# Patient Record
Sex: Female | Born: 1945 | Race: Black or African American | Hispanic: No | Marital: Married | Smoking: Never smoker
Health system: Southern US, Community
[De-identification: ages and names within clinical notes are randomized; demographics above are authoritative.]

## PROBLEM LIST (undated history)

## (undated) DIAGNOSIS — J45909 Unspecified asthma, uncomplicated: Secondary | ICD-10-CM

## (undated) DIAGNOSIS — M5126 Other intervertebral disc displacement, lumbar region: Secondary | ICD-10-CM

## (undated) DIAGNOSIS — IMO0002 Reserved for concepts with insufficient information to code with codable children: Secondary | ICD-10-CM

## (undated) DIAGNOSIS — M5136 Other intervertebral disc degeneration, lumbar region with discogenic back pain only: Secondary | ICD-10-CM

## (undated) DIAGNOSIS — I10 Essential (primary) hypertension: Secondary | ICD-10-CM

## (undated) DIAGNOSIS — R635 Abnormal weight gain: Secondary | ICD-10-CM

## (undated) DIAGNOSIS — K760 Fatty (change of) liver, not elsewhere classified: Secondary | ICD-10-CM

## (undated) DIAGNOSIS — E8940 Asymptomatic postprocedural ovarian failure: Secondary | ICD-10-CM

## (undated) DIAGNOSIS — G4700 Insomnia, unspecified: Secondary | ICD-10-CM

## (undated) DIAGNOSIS — B373 Candidiasis of vulva and vagina: Secondary | ICD-10-CM

## (undated) DIAGNOSIS — B3731 Acute candidiasis of vulva and vagina: Secondary | ICD-10-CM

## (undated) DIAGNOSIS — E785 Hyperlipidemia, unspecified: Secondary | ICD-10-CM

## (undated) DIAGNOSIS — K219 Gastro-esophageal reflux disease without esophagitis: Secondary | ICD-10-CM

## (undated) HISTORY — PX: DILATION AND CURETTAGE OF UTERUS: SHX78

## (undated) HISTORY — DX: Reserved for concepts with insufficient information to code with codable children: IMO0002

## (undated) HISTORY — PX: HYSTEROSCOPY: SHX211

## (undated) HISTORY — DX: Gastro-esophageal reflux disease without esophagitis: K21.9

## (undated) HISTORY — PX: OTHER SURGICAL HISTORY: SHX169

## (undated) HISTORY — DX: Unspecified asthma, uncomplicated: J45.909

## (undated) HISTORY — DX: Fatty (change of) liver, not elsewhere classified: K76.0

## (undated) HISTORY — DX: Asymptomatic postprocedural ovarian failure: E89.40

## (undated) HISTORY — DX: Candidiasis of vulva and vagina: B37.3

## (undated) HISTORY — DX: Abnormal weight gain: R63.5

## (undated) HISTORY — DX: Other intervertebral disc displacement, lumbar region: M51.26

## (undated) HISTORY — DX: Insomnia, unspecified: G47.00

## (undated) HISTORY — DX: Other intervertebral disc degeneration, lumbar region with discogenic back pain only: M51.360

## (undated) HISTORY — DX: Essential (primary) hypertension: I10

## (undated) HISTORY — DX: Acute candidiasis of vulva and vagina: B37.31

## (undated) HISTORY — DX: Hyperlipidemia, unspecified: E78.5

---

## 2001-03-17 HISTORY — PX: BREAST BIOPSY: SHX20

## 2004-03-22 ENCOUNTER — Ambulatory Visit: Payer: Self-pay | Admitting: Unknown Physician Specialty

## 2004-05-20 ENCOUNTER — Ambulatory Visit: Payer: Self-pay | Admitting: Internal Medicine

## 2004-08-21 ENCOUNTER — Inpatient Hospital Stay: Payer: Self-pay | Admitting: Internal Medicine

## 2005-07-21 ENCOUNTER — Ambulatory Visit: Payer: Self-pay | Admitting: Internal Medicine

## 2005-08-12 ENCOUNTER — Ambulatory Visit: Payer: Self-pay | Admitting: Gastroenterology

## 2006-02-02 ENCOUNTER — Emergency Department: Payer: Self-pay | Admitting: Emergency Medicine

## 2006-02-02 ENCOUNTER — Other Ambulatory Visit: Payer: Self-pay

## 2007-01-20 ENCOUNTER — Other Ambulatory Visit: Payer: Self-pay

## 2007-01-20 ENCOUNTER — Ambulatory Visit: Payer: Self-pay | Admitting: Internal Medicine

## 2007-01-25 ENCOUNTER — Other Ambulatory Visit: Payer: Self-pay

## 2007-01-26 ENCOUNTER — Inpatient Hospital Stay: Payer: Self-pay | Admitting: Specialist

## 2007-02-09 ENCOUNTER — Inpatient Hospital Stay: Payer: Self-pay | Admitting: Internal Medicine

## 2007-02-09 ENCOUNTER — Other Ambulatory Visit: Payer: Self-pay

## 2007-02-23 ENCOUNTER — Ambulatory Visit: Payer: Self-pay | Admitting: Internal Medicine

## 2007-03-01 ENCOUNTER — Ambulatory Visit: Payer: Self-pay | Admitting: Internal Medicine

## 2007-10-19 ENCOUNTER — Ambulatory Visit: Payer: Self-pay | Admitting: Internal Medicine

## 2007-12-24 ENCOUNTER — Ambulatory Visit: Payer: Self-pay | Admitting: Unknown Physician Specialty

## 2008-03-02 ENCOUNTER — Ambulatory Visit: Payer: Self-pay | Admitting: Internal Medicine

## 2008-12-12 ENCOUNTER — Emergency Department: Payer: Self-pay | Admitting: Unknown Physician Specialty

## 2009-03-17 HISTORY — PX: CARDIAC CATHETERIZATION: SHX172

## 2009-03-19 ENCOUNTER — Ambulatory Visit: Payer: Self-pay | Admitting: Internal Medicine

## 2009-11-13 ENCOUNTER — Emergency Department: Payer: Self-pay | Admitting: Emergency Medicine

## 2009-12-05 ENCOUNTER — Ambulatory Visit: Payer: Self-pay | Admitting: Gastroenterology

## 2009-12-25 ENCOUNTER — Ambulatory Visit: Payer: Self-pay | Admitting: Specialist

## 2010-01-23 ENCOUNTER — Ambulatory Visit: Payer: Self-pay | Admitting: Surgery

## 2010-03-04 ENCOUNTER — Ambulatory Visit: Payer: Self-pay | Admitting: Internal Medicine

## 2010-03-28 ENCOUNTER — Ambulatory Visit: Payer: Self-pay | Admitting: Internal Medicine

## 2010-03-29 ENCOUNTER — Ambulatory Visit: Payer: Self-pay | Admitting: Internal Medicine

## 2010-04-17 ENCOUNTER — Ambulatory Visit: Payer: Self-pay | Admitting: Internal Medicine

## 2011-03-31 ENCOUNTER — Ambulatory Visit: Payer: Self-pay | Admitting: Internal Medicine

## 2011-04-08 ENCOUNTER — Ambulatory Visit: Payer: Self-pay | Admitting: Obstetrics and Gynecology

## 2011-05-13 ENCOUNTER — Ambulatory Visit: Payer: Self-pay | Admitting: Obstetrics and Gynecology

## 2011-05-13 LAB — CBC
HCT: 39.1 % (ref 35.0–47.0)
HGB: 12.9 g/dL (ref 12.0–16.0)
MCH: 31.3 pg (ref 26.0–34.0)
MCHC: 33 g/dL (ref 32.0–36.0)
MCV: 95 fL (ref 80–100)
Platelet: 237 10*3/uL (ref 150–440)
RBC: 4.12 10*6/uL (ref 3.80–5.20)
WBC: 4.9 10*3/uL (ref 3.6–11.0)

## 2011-05-19 ENCOUNTER — Ambulatory Visit: Payer: Self-pay | Admitting: Obstetrics and Gynecology

## 2011-05-21 LAB — PATHOLOGY REPORT

## 2011-06-16 HISTORY — PX: VAGINAL HYSTERECTOMY: SUR661

## 2011-06-24 ENCOUNTER — Ambulatory Visit: Payer: Self-pay | Admitting: Obstetrics and Gynecology

## 2011-06-24 LAB — BASIC METABOLIC PANEL
Calcium, Total: 9.1 mg/dL (ref 8.5–10.1)
Creatinine: 0.6 mg/dL (ref 0.60–1.30)
EGFR (African American): >60
EGFR (Non-African Amer.): >60
Osmolality: 278 (ref 275–301)
Potassium: 3.7 mmol/L (ref 3.5–5.1)

## 2011-06-24 LAB — CBC
MCH: 30.8 pg (ref 26.0–34.0)
MCHC: 32.7 g/dL (ref 32.0–36.0)
Platelet: 231 10*3/uL (ref 150–440)
RBC: 4.05 10*6/uL (ref 3.80–5.20)
WBC: 4.6 10*3/uL (ref 3.6–11.0)

## 2011-06-30 ENCOUNTER — Ambulatory Visit: Payer: Self-pay | Admitting: Obstetrics and Gynecology

## 2011-07-01 LAB — HEMOGLOBIN: HGB: 11.7 g/dL — ABNORMAL LOW (ref 12.0–16.0)

## 2012-03-17 LAB — HM COLONOSCOPY

## 2012-03-29 ENCOUNTER — Ambulatory Visit: Payer: Self-pay | Admitting: Gastroenterology

## 2012-03-30 LAB — PATHOLOGY REPORT

## 2012-08-12 ENCOUNTER — Ambulatory Visit: Payer: Self-pay | Admitting: Obstetrics and Gynecology

## 2012-08-12 LAB — BASIC METABOLIC PANEL
Anion Gap: 3 — ABNORMAL LOW (ref 7–16)
Creatinine: 0.48 mg/dL — ABNORMAL LOW (ref 0.60–1.30)
EGFR (African American): >60
EGFR (Non-African Amer.): >60
Glucose: 83 mg/dL (ref 65–99)
Sodium: 139 mmol/L (ref 136–145)

## 2012-08-12 LAB — CBC
MCH: 30.2 pg (ref 26.0–34.0)
MCHC: 33.2 g/dL (ref 32.0–36.0)
Platelet: 265 10*3/uL (ref 150–440)
RBC: 4.47 10*6/uL (ref 3.80–5.20)
WBC: 6.1 10*3/uL (ref 3.6–11.0)

## 2012-08-16 ENCOUNTER — Ambulatory Visit: Payer: Self-pay | Admitting: Obstetrics and Gynecology

## 2012-10-05 ENCOUNTER — Ambulatory Visit: Payer: Self-pay | Admitting: Obstetrics and Gynecology

## 2012-12-01 ENCOUNTER — Emergency Department: Payer: Self-pay | Admitting: Emergency Medicine

## 2012-12-01 LAB — COMPREHENSIVE METABOLIC PANEL
Albumin: 3.8 g/dL (ref 3.4–5.0)
Anion Gap: 6 — ABNORMAL LOW (ref 7–16)
Bilirubin,Total: 0.2 mg/dL (ref 0.2–1.0)
Calcium, Total: 9.1 mg/dL (ref 8.5–10.1)
Co2: 27 mmol/L (ref 21–32)
EGFR (African American): >60
EGFR (Non-African Amer.): >60
Glucose: 119 mg/dL — ABNORMAL HIGH (ref 65–99)
Osmolality: 281 (ref 275–301)
Potassium: 3.6 mmol/L (ref 3.5–5.1)
SGOT(AST): 40 U/L — ABNORMAL HIGH (ref 15–37)
SGPT (ALT): 41 U/L (ref 12–78)
Total Protein: 7.3 g/dL (ref 6.4–8.2)

## 2012-12-01 LAB — CK TOTAL AND CKMB (NOT AT ARMC): CK, Total: 113 U/L (ref 21–215)

## 2012-12-01 LAB — TROPONIN I: Troponin-I: <0.02 ng/mL

## 2012-12-01 LAB — CBC
HCT: 39 % (ref 35.0–47.0)
MCH: 30.4 pg (ref 26.0–34.0)
MCHC: 33.8 g/dL (ref 32.0–36.0)
Platelet: 263 10*3/uL (ref 150–440)
WBC: 6.1 10*3/uL (ref 3.6–11.0)

## 2012-12-01 LAB — PRO B NATRIURETIC PEPTIDE: B-Type Natriuretic Peptide: 31 pg/mL (ref 0–125)

## 2013-03-31 ENCOUNTER — Ambulatory Visit: Payer: Self-pay | Admitting: Gastroenterology

## 2013-06-15 DIAGNOSIS — IMO0001 Reserved for inherently not codable concepts without codable children: Secondary | ICD-10-CM | POA: Insufficient documentation

## 2013-06-15 DIAGNOSIS — I200 Unstable angina: Secondary | ICD-10-CM | POA: Insufficient documentation

## 2013-06-15 DIAGNOSIS — I10 Essential (primary) hypertension: Secondary | ICD-10-CM | POA: Insufficient documentation

## 2013-06-15 DIAGNOSIS — K219 Gastro-esophageal reflux disease without esophagitis: Secondary | ICD-10-CM | POA: Insufficient documentation

## 2013-06-24 ENCOUNTER — Ambulatory Visit: Payer: Self-pay | Admitting: Gastroenterology

## 2013-06-24 LAB — CREATININE, SERUM
Creatinine: 0.63 mg/dL (ref 0.60–1.30)
EGFR (African American): >60
EGFR (Non-African Amer.): >60

## 2013-08-22 DIAGNOSIS — J45909 Unspecified asthma, uncomplicated: Secondary | ICD-10-CM | POA: Insufficient documentation

## 2013-11-02 DIAGNOSIS — K760 Fatty (change of) liver, not elsewhere classified: Secondary | ICD-10-CM | POA: Insufficient documentation

## 2013-12-02 ENCOUNTER — Ambulatory Visit: Payer: Self-pay | Admitting: Obstetrics and Gynecology

## 2013-12-02 LAB — HM MAMMOGRAPHY

## 2014-03-17 HISTORY — PX: BREAST BIOPSY: SHX20

## 2014-04-11 DIAGNOSIS — E785 Hyperlipidemia, unspecified: Secondary | ICD-10-CM | POA: Insufficient documentation

## 2014-06-14 ENCOUNTER — Emergency Department: Admit: 2014-06-14 | Disposition: A | Discharge status: Home / Self Care | Payer: Self-pay | Admitting: Student

## 2014-06-30 ENCOUNTER — Ambulatory Visit
Admit: 2014-06-30 | Disposition: A | Discharge status: Home / Self Care | Payer: Self-pay | Attending: Infectious Diseases | Admitting: Infectious Diseases

## 2014-07-07 NOTE — Op Note (Signed)
PATIENT NAME:  Jessica Orozco, Aveen MR#:  [id number] DATE OF BIRTH:  08/30/1945  DATE OF PROCEDURE:  08/16/2012  PREOPERATIVE DIAGNOSES: 1.  Dyspareunia.  2.  Vaginal nodules.   POSTOPERATIVE DIAGNOSES:  1.  Dyspareunia.  2.  Vaginal nodules.  3.  History of adenomyosis, status post transvaginal hysterectomy.   OPERATIVE PROCEDURE: Excision of vaginal mass.   SURGEON: Herold HarmsMartin A DeFrancesco, MD   FIRST ASSISTANT: Earlie LouLindsay Overton, NP and Jules SchickBrittany James, PA-S.   INDICATIONS: The patient is a 69 year old African American female, status post TVH for symptomatic endometriosis, presents for surgical management of dyspareunia with abnormal vaginal exam revealing two isolated nodules just inside the introitus. This area is suspicious for possible endometriosis.   FINDINGS AT SURGERY: Revealed a 3 x 2 cm area of nodularity, cystic, which was excised.   DESCRIPTION OF PROCEDURE: The patient was brought to the operating room where she was placed in the supine position. General anesthesia was induced without difficulty. She was placed in dorsal lithotomy position using the candy cane stirrups. A Betadine perineal, intravaginal prep drape was performed in the standard fashion. A red Robinson catheter was used to drain 100 mL of urine from the vagina. The abnormality was isolated and grasped with Allis clamps. Bovie cautery was used with needle-tipped head to incise the vaginal mucosa to incorporate the excisional biopsy. Hemostasis was achieved with cautery as well as suture. The area of biopsy was closed in 2 layers using 2-0 chromic suture within the base of the excised mass. This was followed by simple interrupted sutures of the vaginal mucosa. Good hemostasis was obtained. Upon completion of the procedure, all instrumentation was removed from the vagina. The patient was then awakened, mobilized, and taken to the recovery room in satisfactory condition.  ESTIMATED BLOOD LOSS: 25 mL.   IV FLUIDS: 700  mL.  URINE OUTPUT: 100 mL.  COMPLICATIONS: None were encountered.   COUNTS:  All instruments, needle and sponge counts were verified as correct.  ____________________________ Prentice DockerMartin A. DeFrancesco, MD mad:cb D: 08/16/2012 16:17:15 ET T: 08/16/2012 21:30:14 ET JOB#: [id number]  cc: Daphine DeutscherMartin A. DeFrancesco, MD, <Dictator> Prentice DockerMARTIN A DEFRANCESCO MD ELECTRONICALLY SIGNED 08/24/2012 15:05

## 2014-07-09 NOTE — Op Note (Signed)
PATIENT NAME:  Jessica Orozco, Jessica Orozco MR#:  [id number] DATE OF BIRTH:  09-Mar-1946  DATE OF PROCEDURE:  05/19/2011  PREOPERATIVE DIAGNOSIS: Postmenopausal bleeding.   POSTOPERATIVE DIAGNOSES:  1. Postmenopausal bleeding.  2. Simple endometrial hyperplasia. 3. Endometrial polyps.   PROCEDURES PERFORMED: 1. Hysteroscopy.  2. Dilation and curettage of the endometrium.   SURGEON: Sharon SellerMartin Jaycelyn Orrison, MD   First Assistant: None.   ANESTHESIA: General by LMA.  INDICATIONS: The patient is a 69 year old African American female with postmenopausal bleeding who had a previous endometrial biopsy that revealed simple endometrial hyperplasia without atypia; however, a more worrisome lesion could not be ruled out. The patient had ultrasound which demonstrated normal pelvic findings. On hysteroscopy several polyps were noted within the endometrial cavity. The bony pelvis was gynecoid. The patient is a good candidate for vaginal hysteroscopy.  DESCRIPTION OF PROCEDURE: The patient was brought to the Operating Room where she was placed in the supine position. General anesthesia was induced without difficulty using the LMA technique. She was placed in the dorsal lithotomy position using candy-cane stirrups. A Betadine perineal and intravaginal prep and drape was performed in the standard fashion. A red Robinson catheter was used to drain residual urine from the bladder measuring 10 mm. A weighted speculum was placed into the vagina and a single-tooth tenaculum was placed on the anterior lip of the cervix. The uterus sounded to 8.5 cm and was noted to be anteverted, mobile and of normal size and shape. The bony pelvis was gynecoid. The endocervical canal was dilated to a #20 JamaicaFrench caliber using Hanks dilator. The [facility] hysteroscope using lactated Ringer's as irrigant was used to do the hysteroscopy. The above-noted findings were photo documented. Upon completion of the hysteroscopy, stone polyp forceps was used to remove  several polyps. Both smooth and serrated curettes were used to perform the curettage with production of minimal tissue. Repeat hysteroscopy revealed no significant tissue left behind. Upon completion of the procedure, all instrumentation was removed from the vagina. The patient was then awakened, mobilized, and taken to the Recovery Room in satisfactory condition. Estimated blood loss was minimal. There were no complications. All instrument, needle, and sponge counts were verified as correct.  ____________________________ Prentice DockerMartin A. Larae Caison, MD mad:slb D: 05/19/2011 15:40:52 ET T: 05/19/2011 16:36:47 ET JOB#: [id number]  cc: Daphine DeutscherMartin A. Clary Boulais, MD, <Dictator> Prentice DockerMARTIN A Rheagan Nayak MD ELECTRONICALLY SIGNED 05/20/2011 13:51

## 2014-07-09 NOTE — H&P (Signed)
PATIENT NAME:  Jessica Orozco, Jessica Orozco MR#:  [id number] DATE OF BIRTH:  [birth year]  DATE OF ADMISSION:  06/30/2011  PREOPERATIVE DIAGNOSIS: Endometrial hyperplasia without atypia.   HISTORY OF PRESENT ILLNESS: Jessica FairyLujester Gaughan is a 69 year old African American female, para 2-0-0-2, menopausal since age 69, on no HRT therapy, who presents for surgical management of endometrial hyperplasia without atypia. The patient developed postmenopausal bleeding in January of 2013. Subsequent ultrasound revealed an endometrial stripe thickness of 6.5 mm. Endometrial biopsy on 04/01/2011 revealed simple hyperplasia without atypia. The specimen was scanty and a more serious process could not be ruled out. The patient subsequently underwent a hysteroscopy with D and C in March of 2013. Pathology revealed endometrial hyperplasia, focally complex without evidence of atypia or malignancy. At this time she is now desiring to proceed with definitive surgery through transvaginal hysterectomy with bilateral salpingo-oophorectomy.   PAST MEDICAL HISTORY:  1. Hyperlipidemia.  2. Hypertensive cardiovascular disease.  3. Gastroesophageal reflux disease.  4. Insomnia.  5. L5-S1 disk disease.  6. Complex endometrial hyperplasia without atypia.   PAST SURGICAL HISTORY:  1. Left breast biopsy.  2. Cardiac catheterization in 04/2009, unremarkable.  3. Hysteroscopy with D and C March of 2013.   PAST OB HISTORY: Para 2-0-0-2, SVD x2 with largest baby being 9 pounds.   PAST GYN HISTORY: Menarche age 89, menopause age 69. No history of abnormal Pap smears. No history of PID or STIs.   FAMILY HISTORY: Colon cancer in first cousin. Ovarian cancer in first cousin. Breast cancer in sister. Brain cancer in brother. Cervical cancer in first cousin.   SOCIAL HISTORY: The patient does not smoke, does not drink, does not use drugs. The patient is a retired Agricultural engineernursing assistant.   CURRENT MEDICATIONS:  1. Zocor 40 mg a day. 2. Protonix 40 mg  b.i.d.  3. Pepcid 40 mg daily. 4. Ventolin HFA 2 puffs q.i.d. p.r.n.  5. Veramyst one spray q.12 hours each nostril. 6. Aleve p.r.n.   DRUG ALLERGIES: None.  REVIEW OF SYSTEMS: The patient denies recent illness. She denies history of coagulopathy. She denies history of reactive airway disease.   PHYSICAL EXAMINATION:   VITAL SIGNS: Height 4 feet 11 inches, weight 128, blood pressure 120/82, heart rate 81, BMI 26.   GENERAL: The patient is a pleasant African American female in no acute distress. She is alert and oriented.   OROPHARYNX: Clear.   NECK: Supple. No thyromegaly or adenopathy.   LUNGS: Clear.   HEART: Regular rate and rhythm without murmur.   ABDOMEN: Soft, nontender. No organomegaly.   PELVIC: External genitalia with mild atrophic changes. BUS normal. Vagina has slight decreased estrogen effect. Cervix is nodular, mobile, and nontender. No gross lesions are seen. Uterus is midplane, top normal size, nontender, and mobile. Adnexa are without palpable masses. Bony pelvis is gynecoid.   EXTREMITIES: Without clubbing, cyanosis, or edema.   IMPRESSION:  1. Complex endometrial hyperplasia without atypia.  2. Status post hysteroscopy with D and C.   PLAN: Total vaginal hysterectomy with bilateral salpingo-oophorectomy. Date of surgery is 06/30/2011.   CONSENT NOTE: Jessica FairyLujester Fearn is to undergo TVH with BSO for endometrial hyperplasia without atypia. She is understanding of the planned procedure and is aware of and is accepting of all surgical risks which include, but are not limited to, bleeding, infection, pelvic organ injury with need for repair, blood clot disorders, anesthesia risks, and death. The patient understands the ovaries may not be able to be removed if they are stuck high within  the pelvic cavity at the time of surgery. She is accepting of this. All questions are answered. Informed consent is given. The patient is ready and willing to proceed with surgery as  scheduled.    ____________________________ Prentice Docker Emanuell Morina, MD mad:drc D: 06/25/2011 14:02:06 ET T: 06/25/2011 14:57:09 ET JOB#: [id number]  cc: Daphine Deutscher A. Farooq Petrovich, MD, <Dictator> Prentice Docker Juvenal Umar MD ELECTRONICALLY SIGNED 06/30/2011 13:59

## 2014-07-09 NOTE — H&P (Signed)
PATIENT NAME:  Jessica FairySTEVENS, Quanita MR#:  [id number] DATE OF BIRTH:  12-11-1945  DATE OF ADMISSION:  05/19/2011  CHIEF COMPLAINT:  1. Postmenopausal bleeding.  2. Endometrial hyperplasia, simple.  HISTORY OF PRESENT ILLNESS: Jessica FairyLujester Schwenke is a 69 year old remarried African American female, para 2-0-0-2, menopausal since age 69 on no hormone replacement therapy, who presents for surgical evaluation of postmenopausal bleeding after one week bleeding episode. Endometrial biopsy on 04/01/2011 revealed simple endometrial hyperplasia without atypia although a more serious process could not be ruled out. Subsequent ultrasound on 04/08/2011 revealed uterus measuring 8.5 x 5.0 x 3.7 cm. The endometrial stripe was 6.4 mm. She now is here for further evaluation for hysteroscopy, dilation and curettage.   PAST MEDICAL HISTORY:  1. Hyperlipidemia.  2. History of hypertensive cardiovascular disease.  3. Reflux syndrome.  4. S1-L5 disk syndrome followed by Dr. Gerrit Heckaliff. 5. Insomnia.   PAST SURGICAL HISTORY: 1. Left breast biopsy.  2. Cardiac catheterization in February 2011, within normal limits.   PAST OB HISTORY: Para 2-0-0-2, spontaneous vaginal delivery x2 with largest baby being 9 pounds.   PAST GYN HISTORY: Menarche age 89, menopause age 69.   FAMILY HISTORY: Colon cancer in first cousin, ovarian cancer in a first cousin, breast cancer in a sister, brain cancer brother.   SOCIAL HISTORY: Patient does not smoke, does not drink, does not use drugs. She is a retired Agricultural engineernursing assistant.   REVIEW OF SYSTEMS: Patient denies recent illness. She has no history of coagulopathy. No history of reactive airway disease.   CURRENT MEDICATIONS:  1. Aleve. 2. Zocor 40 mg a day. 3. Protonix delayed release 40 mg a day. 4. Pepcid 40 mg a day. 5. Ventolin inhaler 2 puffs q.i.d.  6. Veramyst suspension two sprays in both nostrils once a day.   DRUG ALLERGIES: None.   PHYSICAL EXAMINATION:  VITAL SIGNS: Height  4 foot 11, weight 128, blood pressure 136/79, heart rate 81, BMI 26.   GENERAL: Patient is a pleasant African American female in no acute distress. She is alert and oriented. Oropharynx is clear.   NECK: Supple. No thyromegaly or adenopathy.   LUNGS: Clear.   HEART: Regular rate and rhythm without murmur.   ABDOMEN: Soft. No organomegaly. No pelvic masses. No hernias.   PELVIC: External genitalia with mild atrophic changes. BUS normal. Vagina has slightly decreased estrogen effect. Cervix is nodular and mobile, is nontender. No gross lesions are seen. Uterus is midplane, top normal size and nontender. Adnexa are clear without masses. The uterus did sound to 9 cm.   EXTREMITIES: Without clubbing, cyanosis, or edema.   IMPRESSION:  1. Postmenopausal bleeding.  2. History of simple endometrial hyperplasia for which a more serious process cannot be ruled out.   PLAN: Hysteroscopy with dilation and curettage. Date of surgery 05/19/2011.    CONSENT NOTE: Jessica Orozco is to undergo hysteroscopy and dilation and curettage secondary to postmenopausal bleeding with a biopsy showing simple hyperplasia without atypia. She is understanding of the planned procedure and is aware of and accepting of all surgical risks which include but are not limited to bleeding, infection, pelvic organ injury with need for repair, blood clot disorders, anesthesia risks, and death. All questions are answered. Informed consent is given. Patient is ready and willing to proceed with surgery as scheduled.  ____________________________ Prentice DockerMartin A. DeFrancesco, MD mad:cms D: 05/13/2011 17:24:22 ET T: 05/14/2011 05:25:53 ET JOB#: [id number]  cc: Daphine DeutscherMartin A. DeFrancesco, MD, <Dictator> Prentice DockerMARTIN A DEFRANCESCO MD ELECTRONICALLY SIGNED 05/15/2011 8:41

## 2014-07-09 NOTE — Op Note (Signed)
PATIENT NAME:  Jessica Orozco, Jessica Orozco MR#:  [id number] DATE OF BIRTH:  Jun 18, 1945  DATE OF PROCEDURE:  06/30/2011  PREOPERATIVE DIAGNOSIS:  Complex endometrial hyperplasia without atypia.   POSTOPERATIVE DIAGNOSIS:  Complex endometrial hyperplasia without atypia.   PROCEDURE:  Total vaginal hysterectomy, bilateral salpingo-oophorectomy.   SURGEON:  Priscille LovelessM. Dezmon Conover, M.D.   FIRST ASSISTANT: None.   ANESTHESIA: General.   INDICATIONS:  Jessica FairyLujester  Orozco is a 69 year old African American female who presents for surgical management of complex endometrial hyperplasia without atypia. The patient had an episode of postmenopausal bleeding and subsequent hysteroscopy and dilation and curettage revealed the above findings. She opted for definitive surgery through hysterectomy.   FINDINGS AT SURGERY: Grossly normal uterus, tubes, and ovaries.   DESCRIPTION OF PROCEDURE: The patient was brought to the operating room where she was placed in the supine position. General endotracheal anesthesia was induced without difficulty. She was placed in the dorsal lithotomy position using the candy-cane stirrups. A Betadine perineal intravaginal prep and drape was performed in the standard fashion. A Foley catheter was placed into the bladder and was draining clear yellow urine. A weighted speculum was placed into the vagina and a double-tooth tenaculum was placed onto the cervix. Posterior colpotomy was made with Mayo scissors. Uterosacral ligaments were clamped, cut, and stick tied using 0 Vicryl. The cervix was then circumscribed between the uterosacral ligaments respectively anteriorly.   The vaginal mucosa and bladder were dissected off the lower uterine segment through sharp and blunt dissection. Eventually the anterior cul-de-sac was entered. Sequentially the cardinal broad ligament complexes were then clamped, cut, and stick tied using 0 Vicryl. This was carried out to the level of the uteroovarian ligaments which were  then crossclamped and cut. The uterus was removed. The uteroovarian pedicles were then free tied with 0 Vicryl followed by a stick tie using 0 Vicryl. After assessing for adequate hemostasis of all pedicles, the ovaries and tubes were removed respectively. Each tube and ovary was isolated with a Babcock clamp and then a curved Heaney clamp was used to crossclamp the infundibulopelvic ligament. The subsequent adnexal structure was then sharply excised. Both free tie and stick ties were then placed on each pedicle with excellent hemostasis. A small amount of bleeding from the right pedicle was noted and a 2-0 Vicryl of simple running locking stitch was made on the peritoneal serosa. Once this was completed, excellent hemostasis was noted. The posterior cuff was run using 0 Vicryl in a baseball stitch manner. The vagina was then closed with 0 Vicryl in a simple interrupted manner. Upon completion of the procedure, all instrumentation was removed from the vagina. The patient was then mobilized, awakened, and taken to the recovery room in satisfactory condition.  Estimated blood loss was 100 mL.  IV fluids were 900 mL. Urine output was 150 mL. All instrument, needle, and sponge counts were verified as correct. The patient did receive Ancef antibiotic prophylaxis.    ____________________________ [name], MD mad:bjt D: 06/30/2011 15:21:09 ET T: 06/30/2011 17:02:26 ET JOB#: [id number]  cc: Daphine DeutscherMartin A. Floreine Kingdon, MD, <Dictator> Prentice DockerMARTIN A Yarlin Breisch MD ELECTRONICALLY SIGNED 07/01/2011 13:01

## 2014-07-10 ENCOUNTER — Ambulatory Visit
Admit: 2014-07-10 | Disposition: A | Discharge status: Home / Self Care | Payer: Self-pay | Attending: Infectious Diseases | Admitting: Infectious Diseases

## 2014-10-17 ENCOUNTER — Encounter: Payer: Self-pay | Admitting: Obstetrics and Gynecology

## 2014-10-24 ENCOUNTER — Other Ambulatory Visit: Payer: Medicare Other

## 2014-10-24 DIAGNOSIS — R740 Nonspecific elevation of levels of transaminase and lactic acid dehydrogenase [LDH]: Principal | ICD-10-CM

## 2014-10-24 DIAGNOSIS — R748 Abnormal levels of other serum enzymes: Secondary | ICD-10-CM

## 2014-10-24 DIAGNOSIS — R7401 Elevation of levels of liver transaminase levels: Secondary | ICD-10-CM

## 2014-10-24 NOTE — Progress Notes (Signed)
To have repeat Hepatic Panel per Dr. Keturah Barre on 07/13/2014 due to slightly elevated SGPT (37),  Alk Phos (122). Pt in today to have done.

## 2014-10-25 LAB — HEPATIC FUNCTION PANEL
ALT: 67 IU/L — AB (ref 0–32)
AST: 50 IU/L — ABNORMAL HIGH (ref 0–40)
Albumin: 4.4 g/dL (ref 3.6–4.8)
Alkaline Phosphatase: 124 IU/L — ABNORMAL HIGH (ref 39–117)
BILIRUBIN, DIRECT: 0.08 mg/dL (ref 0.00–0.40)
Bilirubin Total: 0.3 mg/dL (ref 0.0–1.2)
TOTAL PROTEIN: 7.3 g/dL (ref 6.0–8.5)

## 2014-11-01 ENCOUNTER — Ambulatory Visit (INDEPENDENT_AMBULATORY_CARE_PROVIDER_SITE_OTHER): Payer: Medicare Other | Admitting: Obstetrics and Gynecology

## 2014-11-01 ENCOUNTER — Encounter: Payer: Self-pay | Admitting: Obstetrics and Gynecology

## 2014-11-01 VITALS — BP 120/80 | HR 86 | Ht 59.0 in | Wt 125.4 lb

## 2014-11-01 DIAGNOSIS — K760 Fatty (change of) liver, not elsewhere classified: Secondary | ICD-10-CM

## 2014-11-01 DIAGNOSIS — Z01419 Encounter for gynecological examination (general) (routine) without abnormal findings: Secondary | ICD-10-CM | POA: Diagnosis not present

## 2014-11-01 DIAGNOSIS — Z90710 Acquired absence of both cervix and uterus: Secondary | ICD-10-CM

## 2014-11-01 DIAGNOSIS — Z803 Family history of malignant neoplasm of breast: Secondary | ICD-10-CM

## 2014-11-01 DIAGNOSIS — Z90722 Acquired absence of ovaries, bilateral: Secondary | ICD-10-CM

## 2014-11-01 DIAGNOSIS — Z1239 Encounter for other screening for malignant neoplasm of breast: Secondary | ICD-10-CM | POA: Diagnosis not present

## 2014-11-01 DIAGNOSIS — Z1211 Encounter for screening for malignant neoplasm of colon: Secondary | ICD-10-CM | POA: Diagnosis not present

## 2014-11-01 DIAGNOSIS — N958 Other specified menopausal and perimenopausal disorders: Secondary | ICD-10-CM

## 2014-11-01 DIAGNOSIS — Z9079 Acquired absence of other genital organ(s): Secondary | ICD-10-CM | POA: Diagnosis not present

## 2014-11-01 DIAGNOSIS — L720 Epidermal cyst: Secondary | ICD-10-CM | POA: Diagnosis not present

## 2014-11-01 DIAGNOSIS — E8940 Asymptomatic postprocedural ovarian failure: Secondary | ICD-10-CM | POA: Insufficient documentation

## 2014-11-01 MED ORDER — ESTRADIOL 0.1 MG/GM VA CREA: 1.0000 | TOPICAL_CREAM | VAGINAL | Status: DC

## 2014-11-01 NOTE — Progress Notes (Signed)
Patient ID: Jessica Orozco, female   DOB: 09/15/1945, 69 y.o.   MRN: [id number] CMA Intake: Pt presents for breast and pelvic exam. Last visit for annual 10/14/2014. Pt is medicare so annual will be due next year. No significant changes in history. No longer taking Estradiol due to a swollen gland that developed in face/neck. Mammogram: 11/29/2013 Pap: 2013 normal Dx: surgical menopause: TVH, BSO--Adenomyosis; Fatty Liver, Chronic HTN. Medications: Premarin 0.625mg /gm 3xwk.  Pt c/o vaginal pain and had some mild leakage from left breast.   GYN ANNUAL PREVENTATIVE CARE ENCOUNTER NOTE  Subjective:       Jessica Orozco is a 69 y.o. G7P2002 female here for a routine annual gynecologic exam.  Current complaints:  1.  Surgical menopause. 2.  History of fatty liver with elevated liver function tests. 3.  Family history of breast cancer. 4.  Vaginal atrophy.  The patient is status post a TVH BSO for adenomyosis.  Previously she was on ERT therapy but has discontinued it for weeks ago because of elevated liver function tests.  Recent testing has demonstrated a persistent elevation of her transaminases and alkaline phosphatase.  She is to see her internist for follow-up of this condition.  She has since discontinued her ERT therapy.  She does use vaginal estrogen cream once or twice a week for vaginal atrophy. Patient is complaining of a persistent nodule on the upper aspect of her neck near the occiput.    Gynecologic History No LMP recorded.Status post TVH, BSO Contraception: status post hysterectomy   Obstetric History OB History  Gravida Para Term Preterm AB SAB TAB Ectopic Multiple Living  2 2 2       2     # Outcome Date GA Lbr Len/2nd Weight Sex Delivery Anes PTL Lv  2 Term 05/12/73    F Vag-Spont   Y  1 Term 08/06/66    F Vag-Spont   Y      Past Medical History  Diagnosis Date  . Insomnia   . Hyperlipemia   . Asthma   . Discogenic syndrome, lumbar   . GERD  (gastroesophageal reflux disease)   . Monilial vaginitis   . Hypertension   . Fatty liver   . Surgical menopause   . Weight gain   . Dyspareunia     Past Surgical History  Procedure Laterality Date  . Excision of vaginal mass      b9 vaginal cyst  . Dilation and curettage of uterus    . Hysteroscopy    . Cardiac catheterization  2011    wnl  . Breast biopsy    . Vaginal hysterectomy  06/2011    TVH/BSO- adenomyosis fibroids no hyperplasia    Current Outpatient Prescriptions on File Prior to Visit  Medication Sig Dispense Refill  . albuterol (VENTOLIN HFA) 108 (90 BASE) MCG/ACT inhaler Inhale into the lungs.    . Ascorbic Acid (VITAMIN C) 1000 MG tablet Take by mouth.    . budesonide (PULMICORT FLEXHALER) 180 MCG/ACT inhaler USE 1 PUFF EVERY 12 HOURS    . conjugated estrogens (PREMARIN) vaginal cream Place 1 Applicatorful vaginally daily.    . fluticasone (VERAMYST) 27.5 MCG/SPRAY nasal spray USE 2 SPRAYS IN EACH  NOSTRIL DAILY    . hydrochlorothiazide (HYDRODIURIL) 12.5 MG tablet Take by mouth.    . montelukast (SINGULAIR) 10 MG tablet TAKE 1 TABLET (10 MG TOTAL) BY MOUTH ONCE DAILY.    . pantoprazole (PROTONIX) 40 MG tablet Take by mouth.    Marland Kitchen  Vitamin D, Ergocalciferol, (DRISDOL) 50000 UNITS CAPS capsule Take by mouth.    . DULoxetine (CYMBALTA) 20 MG capsule Take by mouth.    . estradiol (ESTRACE) 1 MG tablet Take by mouth.    . simvastatin (ZOCOR) 40 MG tablet Take by mouth.     No current facility-administered medications on file prior to visit.    No Known Allergies  Social History   Social History  . Marital Status: Married    Spouse Name: N/A  . Number of Children: N/A  . Years of Education: N/A   Occupational History  . Not on file.   Social History Main Topics  . Smoking status: Never Smoker   . Smokeless tobacco: Not on file  . Alcohol Use: No  . Drug Use: No  . Sexual Activity: Yes    Birth Control/ Protection: Post-menopausal   Other Topics  Concern  . Not on file   Social History Narrative    Family History  Problem Relation Age of Onset  . Breast cancer Sister   . Brain cancer Brother     The following portions of the patient's history were reviewed and updated as appropriate: allergies, current medications, past family history, past medical history, past social history, past surgical history and problem list.  Review of Systems ROS  Review of systems negative except for history of present illness documentation and recent skin itching, now resolved.    Objective:   BP 120/80 mmHg  Pulse 86  Ht  (1.499 m)  Wt 125 lb 7 oz (56.898 kg)  BMI 25.32 kg/m2 Physical Exam: Pleasant, well-appearing African American female in no acute distress. HEENT normocephalic, atraumatic. Neck: Supple without thyromegaly or adenopathy.  There is a 3 cm nodule noted on the right posterior neck just inferior to the occiput-consistent with possible lipoma versus subcutaneous cyst. Breasts: Bilaterally symmetric without dominant mass, adenopathy or nipple discharge. Lungs: Clear. Heart: Regular rate and rhythm without murmur. Abdomen: Soft, nontender, without organomegaly. Pelvic exam:  External genitalia: Normal, except for atrophic changes.  BUS: Normal  Vagina: Mild atrophic changes; no lesions.  Cervix: Surgically absent  Uterus: Surgically absent  Bimanual: No palpable masses or tenderness.  Rectovaginal: Normal.  External exam; normal sphincter tone,; no rectal masses. Extremities: Without clubbing, cyanosis or edema. Skin: No rash; no scleral icterus  Assessment:   Annual gynecologic examination 69 y.o. Contraception: status post hysterectomy Normal BMI Surgical menopause; minimally symptomatic; off ERT Fatty liver; elevated liver enzymes, unclear etiology    Plan:  Pap: Not needed Mammogram: Ordered Labs: deferred to primary care Routine preventative health maintenance measures emphasized: Diet/Weight  control Continue vaginal estrogen cream once or twice a week. Follow-up with Dr. Sampson Goon regarding fatty liver, and elevated liver enzymes Return to Clinic - 1 Year   Herold Harms, MD ..

## 2014-11-03 ENCOUNTER — Encounter: Payer: Self-pay | Admitting: General Surgery

## 2014-11-08 ENCOUNTER — Ambulatory Visit (INDEPENDENT_AMBULATORY_CARE_PROVIDER_SITE_OTHER): Payer: Medicare Other | Admitting: General Surgery

## 2014-11-08 ENCOUNTER — Encounter: Payer: Self-pay | Admitting: General Surgery

## 2014-11-08 VITALS — BP 137/81 | HR 87 | Temp 98.3°F | Ht 59.0 in | Wt 125.0 lb

## 2014-11-08 DIAGNOSIS — L723 Sebaceous cyst: Secondary | ICD-10-CM | POA: Diagnosis not present

## 2014-11-08 NOTE — Patient Instructions (Signed)
We will see you tomorrow to do an Incision and Drainage from the back of your neck.

## 2014-11-08 NOTE — Progress Notes (Signed)
  Surgical Consultation  11/08/2014  Jessica Orozco is an 69 y.o. female.   Chief Complaint  Patient presents with  . Other    Epidermal cyst on back of neck     HPI: 69 year old female presents to clinic for evaluation of a right posterior neck cyst. Patient states has been there for some time however she has never been infected or required drainage. She has recently been applying heat and pressure and attempts to make it smaller for which she states she has gotten some foul smelling cream colored fluid/material out of it. His of the base of her hairline sometimes his coronary brush otherwise it does not cause any pain or discomfort. She is interested in having it removed, however she has not mentioned order for this to be completed. She is otherwise in her usual state of health with no other complaints.  Past Medical History  Diagnosis Date  . Insomnia   . Hyperlipemia   . Asthma   . Discogenic syndrome, lumbar   . GERD (gastroesophageal reflux disease)   . Monilial vaginitis   . Hypertension   . Fatty liver   . Surgical menopause   . Weight gain   . Dyspareunia     Past Surgical History  Procedure Laterality Date  . Excision of vaginal mass      b9 vaginal cyst  . Dilation and curettage of uterus    . Hysteroscopy    . Cardiac catheterization  2011    wnl  . Breast biopsy    . 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 Breast cancer Sister   . Brain cancer Brother     Social History:  reports that she has never smoked. She does not have any smokeless tobacco history on file. She reports that she does not drink alcohol or use illicit drugs.  Allergies: No Known Allergies  Medications reviewed.     ROS a multisystem review of systems was completed all pertinent positives within history of present illness the remainder were negative.     BP 137/81 mmHg  Pulse 87   Temp(Src) 98.3 F (36.8 C) (Oral)  Ht  (1.499 m)  Wt 56.7 kg (125 lb)  BMI 25.23 kg/m2  Physical Exam Gen.: No acute distress Neck: Supple, nontender. A 1.5 cm hard superficial cyst present at the hairline on the right posterior neck. Consistent with sebaceous cyst. Chest: Clear to auscultation regular rate and rhythm Abdomen soft nontender nondistended.   No results found for this or any previous visit (from the past 48 hour(s)). No results found.  Assessment/Plan: 1. Sebaceous cyst 69 year old female with a noninfected sebaceous cyst of the right posterior neck. Patient return to clinic for office procedure for removal tomorrow at 11:15 AM. QUESTIONS were answered prior to the discharge from clinic, we'll see back tomorrow for procedure.   Mila Merry, MD [facility] Surgeon [facility] 11/08/2014

## 2014-11-09 ENCOUNTER — Ambulatory Visit: Payer: Medicare Other | Admitting: General Surgery

## 2014-11-21 ENCOUNTER — Ambulatory Visit: Payer: Medicare Other | Admitting: General Surgery

## 2014-11-22 DIAGNOSIS — M797 Fibromyalgia: Secondary | ICD-10-CM | POA: Insufficient documentation

## 2014-11-30 ENCOUNTER — Other Ambulatory Visit: Payer: Self-pay | Admitting: Obstetrics and Gynecology

## 2014-11-30 DIAGNOSIS — Z803 Family history of malignant neoplasm of breast: Secondary | ICD-10-CM

## 2014-11-30 DIAGNOSIS — Z1239 Encounter for other screening for malignant neoplasm of breast: Secondary | ICD-10-CM

## 2014-12-04 ENCOUNTER — Other Ambulatory Visit: Payer: Self-pay | Admitting: Obstetrics and Gynecology

## 2014-12-04 DIAGNOSIS — Z1231 Encounter for screening mammogram for malignant neoplasm of breast: Secondary | ICD-10-CM

## 2014-12-05 ENCOUNTER — Telehealth: Payer: Self-pay | Admitting: Obstetrics and Gynecology

## 2014-12-05 ENCOUNTER — Ambulatory Visit
Admission: RE | Admit: 2014-12-05 | Discharge: 2014-12-05 | Disposition: A | Discharge status: Home / Self Care | Payer: Medicare Other | Source: Ambulatory Visit | Attending: Obstetrics and Gynecology | Admitting: Obstetrics and Gynecology

## 2014-12-05 DIAGNOSIS — N6452 Nipple discharge: Secondary | ICD-10-CM

## 2014-12-05 DIAGNOSIS — Z1231 Encounter for screening mammogram for malignant neoplasm of breast: Secondary | ICD-10-CM

## 2014-12-05 NOTE — Telephone Encounter (Signed)
SHE FORGOT TO TELL DR DE WHEN SHE WAS HERE THAT SHE HAS SOME LEAKAGE IN LEFT BREAST, SHE WONDERED WHAT SHE NEEDS TO DO ABOUT THIS, SHOULD SHE COME IN AND BE SEEN OR SHOULD DR DE GO AHEAD AND OUT AN ORDER IN FOR A DX MAMMO.

## 2014-12-06 ENCOUNTER — Other Ambulatory Visit: Payer: Self-pay | Admitting: Surgery

## 2014-12-06 NOTE — Telephone Encounter (Signed)
I spoke with pt again and Dr. Valentino Saxon had said that Dr. Algis Downs may have been waiting on the mammogram results. Pt states she did not have mammogram yesterday of which I apparently misunderstood pt. Dr. Valentino Saxon said it would be okay to order her an ultrasound to have along with her mammogram.

## 2014-12-06 NOTE — Addendum Note (Signed)
Addended by: Jackquline Denmark on: 12/06/2014 11:19 AM   Modules accepted: Orders

## 2014-12-06 NOTE — Telephone Encounter (Signed)
I spoke with Jessica Orozco and she had routine screening mammogram yesterday and had mentioned to the radiologist that she has had some nipple discharge. She advised her to contact Dr. Algis Downs. Jessica Orozco spoke of this at visit but was not at the time having any nipple discharge. I asked how long has this been going on and she said it has happened twice. Left breast discharge was clear. Not currently having any left breast nipple discharge.  Dr. Algis Downs out of office so I informed Jessica Orozco I would speak with Dr. Valentino Saxon and call her back.

## 2014-12-06 NOTE — Telephone Encounter (Signed)
[facility] called and needs ultrasound order corrected and diagnostic mammogram ordered. Pt to call for appt and pt is going to do this but she is at work.

## 2014-12-12 ENCOUNTER — Telehealth: Payer: Self-pay | Admitting: Obstetrics and Gynecology

## 2014-12-12 ENCOUNTER — Other Ambulatory Visit: Payer: Self-pay

## 2014-12-12 DIAGNOSIS — N6452 Nipple discharge: Secondary | ICD-10-CM

## 2014-12-12 NOTE — Telephone Encounter (Signed)
PT CALLED AND SAID [facility] NEEDS ORDER CHANGED AND NEEDS [place] TO CALL TO CHANGE ORDER. nOT SURE WHAT IT IS SUPPOSED TO BE.

## 2014-12-13 NOTE — Telephone Encounter (Signed)
Pt aware orders are put in. Mammo and u/s scheduled for next week.

## 2014-12-21 ENCOUNTER — Ambulatory Visit
Admission: RE | Admit: 2014-12-21 | Discharge: 2014-12-21 | Disposition: A | Discharge status: Home / Self Care | Payer: Medicare Other | Source: Ambulatory Visit | Attending: Obstetrics and Gynecology | Admitting: Obstetrics and Gynecology

## 2014-12-21 ENCOUNTER — Other Ambulatory Visit: Payer: Self-pay | Admitting: Obstetrics and Gynecology

## 2014-12-21 DIAGNOSIS — N6452 Nipple discharge: Secondary | ICD-10-CM

## 2014-12-21 DIAGNOSIS — N63 Unspecified lump in breast: Secondary | ICD-10-CM | POA: Insufficient documentation

## 2014-12-22 ENCOUNTER — Other Ambulatory Visit: Payer: Self-pay | Admitting: Obstetrics and Gynecology

## 2014-12-22 DIAGNOSIS — N6320 Unspecified lump in the left breast, unspecified quadrant: Secondary | ICD-10-CM

## 2014-12-25 ENCOUNTER — Telehealth: Payer: Self-pay | Admitting: Obstetrics and Gynecology

## 2014-12-25 NOTE — Telephone Encounter (Signed)
SHE WANTS TO KNOW IF HER MAMMO RESULTS ARE BACK AND SAID THEY TOLD HER SOMETHING WAS IN HER MILK DUCT AND WANTS TO TALK ABOUT IT

## 2014-12-27 ENCOUNTER — Ambulatory Visit
Admission: RE | Admit: 2014-12-27 | Discharge: 2014-12-27 | Disposition: A | Discharge status: Home / Self Care | Payer: Medicare Other | Source: Ambulatory Visit | Attending: Obstetrics and Gynecology | Admitting: Obstetrics and Gynecology

## 2014-12-27 DIAGNOSIS — N6320 Unspecified lump in the left breast, unspecified quadrant: Secondary | ICD-10-CM

## 2014-12-27 DIAGNOSIS — N6002 Solitary cyst of left breast: Secondary | ICD-10-CM | POA: Diagnosis not present

## 2014-12-27 DIAGNOSIS — N63 Unspecified lump in breast: Secondary | ICD-10-CM | POA: Diagnosis present

## 2014-12-27 NOTE — Telephone Encounter (Signed)
Pt states she tried to have bx today but the cyst burst during the procedure. Do you need see her?

## 2014-12-28 NOTE — Telephone Encounter (Signed)
Pt aware. She has questions appt scheduled for 10/26 7:30

## 2015-01-10 ENCOUNTER — Ambulatory Visit: Payer: Medicare Other | Admitting: Obstetrics and Gynecology

## 2015-04-13 DIAGNOSIS — M6208 Separation of muscle (nontraumatic), other site: Secondary | ICD-10-CM | POA: Insufficient documentation

## 2015-04-17 ENCOUNTER — Other Ambulatory Visit: Payer: Self-pay | Admitting: Gastroenterology

## 2015-04-17 DIAGNOSIS — R945 Abnormal results of liver function studies: Secondary | ICD-10-CM

## 2015-04-17 DIAGNOSIS — R7989 Other specified abnormal findings of blood chemistry: Secondary | ICD-10-CM

## 2015-04-23 ENCOUNTER — Ambulatory Visit
Admission: RE | Admit: 2015-04-23 | Discharge: 2015-04-23 | Disposition: A | Discharge status: Home / Self Care | Payer: Medicare Other | Source: Ambulatory Visit | Attending: Gastroenterology | Admitting: Gastroenterology

## 2015-04-23 DIAGNOSIS — R7989 Other specified abnormal findings of blood chemistry: Secondary | ICD-10-CM | POA: Diagnosis present

## 2015-04-23 DIAGNOSIS — K760 Fatty (change of) liver, not elsewhere classified: Secondary | ICD-10-CM | POA: Diagnosis not present

## 2015-04-23 DIAGNOSIS — R945 Abnormal results of liver function studies: Secondary | ICD-10-CM

## 2015-07-04 DIAGNOSIS — K7581 Nonalcoholic steatohepatitis (NASH): Secondary | ICD-10-CM | POA: Insufficient documentation

## 2015-07-16 DIAGNOSIS — Z8639 Personal history of other endocrine, nutritional and metabolic disease: Secondary | ICD-10-CM | POA: Insufficient documentation

## 2015-10-07 ENCOUNTER — Other Ambulatory Visit: Payer: Self-pay | Admitting: Obstetrics and Gynecology

## 2015-10-07 DIAGNOSIS — E8940 Asymptomatic postprocedural ovarian failure: Secondary | ICD-10-CM

## 2015-11-06 ENCOUNTER — Encounter: Payer: Medicare Other | Admitting: Obstetrics and Gynecology

## 2015-11-26 NOTE — Progress Notes (Deleted)
ANNUAL PREVENTATIVE CARE GYN  ENCOUNTER NOTE  Subjective:       Jessica Orozco is a 70 y.o. [id number] female here for a routine annual gynecologic exam.  Current complaints: 1.      Gynecologic History No LMP recorded. Patient has had a hysterectomy. Contraception: status post hysterectomy Last Pap: 2013 wnl Results were: normal Last mammogram: 12/2014- [facility] 4- left breast u/s with aspiration of 3mm cyst. Results were: normal  Obstetric History OB History  Gravida Para Term Preterm AB Living  2 2 2     2   SAB TAB Ectopic Multiple Live Births          2    # Outcome Date GA Lbr Len/2nd Weight Sex Delivery Anes PTL Lv  2 Term 05/12/73    F Vag-Spont   LIV  1 Term 08/06/66    F Vag-Spont   LIV      Past Medical History:  Diagnosis Date  . Asthma   . Discogenic syndrome, lumbar   . Dyspareunia   . Fatty liver   . GERD (gastroesophageal reflux disease)   . Hyperlipemia   . Hypertension   . Insomnia   . Monilial vaginitis   . Surgical menopause   . Weight gain     Past Surgical History:  Procedure Laterality Date  . BREAST BIOPSY Left 2003   EXCISIONAL - NEG  . CARDIAC CATHETERIZATION  2011   wnl  . DILATION AND CURETTAGE OF UTERUS    . excision of vaginal mass     b9 vaginal cyst  . HYSTEROSCOPY    . VAGINAL HYSTERECTOMY  06/2011   TVH/BSO- adenomyosis fibroids no hyperplasia    Current Outpatient Prescriptions on File Prior to Visit  Medication Sig Dispense Refill  . albuterol (VENTOLIN HFA) 108 (90 BASE) MCG/ACT inhaler Inhale into the lungs.    . Ascorbic Acid (VITAMIN C) 1000 MG tablet Take by mouth.    . budesonide (PULMICORT FLEXHALER) 180 MCG/ACT inhaler USE 1 PUFF EVERY 12 HOURS    . ESTRACE VAGINAL 0.1 MG/GM vaginal cream PLACE 1 APPLICATORFUL VAGINALLY 2 TO 3 TIMES A WEEK (CHANGE FROM PREMARIN AND IS SIMILAR MEDICATION) 42.5 g 2  . fluticasone (VERAMYST) 27.5 MCG/SPRAY nasal spray USE 2 SPRAYS IN EACH  NOSTRIL DAILY    . hydrochlorothiazide  (HYDRODIURIL) 12.5 MG tablet Take by mouth.    . hydrochlorothiazide (MICROZIDE) 12.5 MG capsule TAKE 1 CAPSULE (12.5 MG TOTAL) BY MOUTH ONCE DAILY.  11  . montelukast (SINGULAIR) 10 MG tablet TAKE 1 TABLET (10 MG TOTAL) BY MOUTH ONCE DAILY.    . pantoprazole (PROTONIX) 40 MG tablet Take by mouth.    . Vitamin D, Ergocalciferol, (DRISDOL) 50000 UNITS CAPS capsule Take by mouth.     No current facility-administered medications on file prior to visit.     No Known Allergies  Social History   Social History  . Marital status: Married    Spouse name: N/A  . Number of children: N/A  . Years of education: N/A   Occupational History  . Not on file.   Social History Main Topics  . Smoking status: Never Smoker  . Smokeless tobacco: Not on file  . Alcohol use No  . Drug use: No  . Sexual activity: Yes    Birth control/ protection: Post-menopausal   Other Topics Concern  . Not on file   Social History Narrative  . No narrative on file    Family History  Problem Relation  Age of Onset  . Breast cancer Sister 35  . Brain cancer Brother   . Breast cancer Maternal Aunt   . Breast cancer Cousin     The following portions of the patient's history were reviewed and updated as appropriate: allergies, current medications, past family history, past medical history, past social history, past surgical history and problem list.  Review of Systems ROS Review of Systems - General ROS: negative for - chills, fatigue, fever, hot flashes, night sweats, weight gain or weight loss Psychological ROS: negative for - anxiety, decreased libido, depression, mood swings, physical abuse or sexual abuse Ophthalmic ROS: negative for - blurry vision, eye pain or loss of vision ENT ROS: negative for - headaches, hearing change, visual changes or vocal changes Allergy and Immunology ROS: negative for - hives, itchy/watery eyes or seasonal allergies Hematological and Lymphatic ROS: negative for - bleeding  problems, bruising, swollen lymph nodes or weight loss Endocrine ROS: negative for - galactorrhea, hair pattern changes, hot flashes, malaise/lethargy, mood swings, palpitations, polydipsia/polyuria, skin changes, temperature intolerance or unexpected weight changes Breast ROS: negative for - new or changing breast lumps or nipple discharge Respiratory ROS: negative for - cough or shortness of breath Cardiovascular ROS: negative for - chest pain, irregular heartbeat, palpitations or shortness of breath Gastrointestinal ROS: no abdominal pain, change in bowel habits, or black or bloody stools Genito-Urinary ROS: no dysuria, trouble voiding, or hematuria Musculoskeletal ROS: negative for - joint pain or joint stiffness Neurological ROS: negative for - bowel and bladder control changes Dermatological ROS: negative for rash and skin lesion changes   Objective:   There were no vitals taken for this visit. CONSTITUTIONAL: Well-developed, well-nourished female in no acute distress.  PSYCHIATRIC: Normal mood and affect. Normal behavior. Normal judgment and thought content. NEUROLGIC: Alert and oriented to person, place, and time. Normal muscle tone coordination. No cranial nerve deficit noted. HENT:  Normocephalic, atraumatic, External right and left ear normal. Oropharynx is clear and moist EYES: Conjunctivae and EOM are normal. Pupils are equal, round, and reactive to light. No scleral icterus.  NECK: Normal range of motion, supple, no masses.  Normal thyroid.  SKIN: Skin is warm and dry. No rash noted. Not diaphoretic. No erythema. No pallor. CARDIOVASCULAR: Normal heart rate noted, regular rhythm, no murmur. RESPIRATORY: Clear to auscultation bilaterally. Effort and breath sounds normal, no problems with respiration noted. BREASTS: Symmetric in size. No masses, skin changes, nipple drainage, or lymphadenopathy. ABDOMEN: Soft, normal bowel sounds, no distention noted.  No tenderness, rebound or  guarding.  BLADDER: Normal PELVIC:  External Genitalia: Normal  BUS: Normal  Vagina: Normal  Cervix: Normal  Uterus: Normal  Adnexa: Normal  RV: {Blank multiple:19196::"External Exam NormaI","No Rectal Masses","Normal Sphincter tone"}  MUSCULOSKELETAL: Normal range of motion. No tenderness.  No cyanosis, clubbing, or edema.  2+ distal pulses. LYMPHATIC: No Axillary, Supraclavicular, or Inguinal Adenopathy.    Assessment:   Annual gynecologic examination 70 y.o. Contraception: status post hysterectomy Normal BMI Problem List Items Addressed This Visit    Surgical menopause    Other Visit Diagnoses    Well woman exam    -  Primary   Family history of breast cancer       Status post total hysterectomy and bilateral salpingo-oophorectomy (BSO)       Screening for colon cancer       Screening for breast cancer          Plan:  Pap: Not needed Mammogram: Ordered Stool Guaiac Testing:  Ordered Labs: thru pcp Routine preventative health maintenance measures emphasized: {Blank multiple:19196::"Exercise/Diet/Weight control","Tobacco Warnings","Alcohol/Substance use risks","Stress Management","Peer Pressure Issues","Safe Sex"} *** Return to Clinic - 1 [address] [place], [place]

## 2015-11-29 ENCOUNTER — Encounter: Payer: Medicare Other | Admitting: Obstetrics and Gynecology

## 2015-12-06 DIAGNOSIS — G4734 Idiopathic sleep related nonobstructive alveolar hypoventilation: Secondary | ICD-10-CM | POA: Insufficient documentation

## 2015-12-31 NOTE — Progress Notes (Signed)
ANNUAL PREVENTATIVE CARE GYN  ENCOUNTER NOTE  Subjective:       Jessica Orozco is a 70 y.o. [id number] female here for a routine annual gynecologic exam.  Current complaints: 1.  Mild left breast tenderness and severe was removed   Major health issue includes liver disease [facility].  No changes in bowel or bladder function.  Gynecologic History No LMP recorded. Patient has had a hysterectomy. Status post TVH BSO Contraception: status post hysterectomy Last Pap: 2013. Results were: normal Last mammogram: 12/2014- left breast cyst aspiration. Results were: normal History of endometriosis/adenomyosis Currently using Estrace cream intravaginal  Obstetric History OB History  Gravida Para Term Preterm AB Living  2 2 2     2   SAB TAB Ectopic Multiple Live Births          2    # Outcome Date GA Lbr Len/2nd Weight Sex Delivery Anes PTL Lv  2 Term 05/12/73    F Vag-Spont   LIV  1 Term 08/06/66    F Vag-Spont   LIV      Past Medical History:  Diagnosis Date  . Asthma   . Discogenic syndrome, lumbar   . Dyspareunia   . Fatty liver   . GERD (gastroesophageal reflux disease)   . Hyperlipemia   . Hypertension   . Insomnia   . Monilial vaginitis   . Surgical menopause   . Weight gain     Past Surgical History:  Procedure Laterality Date  . BREAST BIOPSY Left 2003   EXCISIONAL - NEG  . CARDIAC CATHETERIZATION  2011   wnl  . DILATION AND CURETTAGE OF UTERUS    . excision of vaginal mass     b9 vaginal cyst  . HYSTEROSCOPY    . VAGINAL HYSTERECTOMY  06/2011   TVH/BSO- adenomyosis fibroids no hyperplasia    Current Outpatient Prescriptions on File Prior to Visit  Medication Sig Dispense Refill  . albuterol (VENTOLIN HFA) 108 (90 BASE) MCG/ACT inhaler Inhale into the lungs.    . Ascorbic Acid (VITAMIN C) 1000 MG tablet Take by mouth.    . budesonide (PULMICORT FLEXHALER) 180 MCG/ACT inhaler USE 1 PUFF EVERY 12 HOURS    . ESTRACE VAGINAL 0.1 MG/GM  vaginal cream PLACE 1 APPLICATORFUL VAGINALLY 2 TO 3 TIMES A WEEK (CHANGE FROM PREMARIN AND IS SIMILAR MEDICATION) 42.5 g 2  . fluticasone (VERAMYST) 27.5 MCG/SPRAY nasal spray USE 2 SPRAYS IN EACH  NOSTRIL DAILY    . hydrochlorothiazide (HYDRODIURIL) 12.5 MG tablet Take by mouth.    . hydrochlorothiazide (MICROZIDE) 12.5 MG capsule TAKE 1 CAPSULE (12.5 MG TOTAL) BY MOUTH ONCE DAILY.  11  . montelukast (SINGULAIR) 10 MG tablet TAKE 1 TABLET (10 MG TOTAL) BY MOUTH ONCE DAILY.    . pantoprazole (PROTONIX) 40 MG tablet Take by mouth.    . Vitamin D, Ergocalciferol, (DRISDOL) 50000 UNITS CAPS capsule Take by mouth.     No current facility-administered medications on file prior to visit.     No Known Allergies  Social History   Social History  . Marital status: Married    Spouse name: N/A  . Number of children: N/A  . Years of education: N/A   Occupational History  . Not on file.   Social History Main Topics  . Smoking status: Never Smoker  . Smokeless tobacco: Not on file  . Alcohol use No  . Drug use: No  . Sexual activity: Yes    Birth control/  protection: Post-menopausal   Other Topics Concern  . Not on file   Social History Narrative  . No narrative on file    Family History  Problem Relation Age of Onset  . Breast cancer Sister 47  . Brain cancer Brother   . Breast cancer Maternal Aunt   . Breast cancer Cousin     The following portions of the patient's history were reviewed and updated as appropriate: allergies, current medications, past family history, past medical history, past social history, past surgical history and problem list.  Review of Systems ROS Review of Systems - General ROS: negative for - chills, fatigue, fever, hot flashes, night sweats, weight gain or weight loss Psychological ROS: negative for - anxiety, decreased libido, depression, mood swings, physical abuse or sexual abuse Ophthalmic ROS: negative for - blurry vision, eye pain or loss of  vision ENT ROS: negative for - headaches, hearing change, visual changes or vocal changes Allergy and Immunology ROS: negative for - hives, itchy/watery eyes or seasonal allergies Hematological and Lymphatic ROS: negative for - bleeding problems, bruising, swollen lymph nodes or weight loss Endocrine ROS: negative for - galactorrhea, hair pattern changes, hot flashes, malaise/lethargy, mood swings, palpitations, polydipsia/polyuria, skin changes, temperature intolerance or unexpected weight changes Breast ROS: negative for - new or changing breast lumps or nipple discharge Respiratory ROS: negative for - cough or shortness of breath Cardiovascular ROS: negative for - chest pain, irregular heartbeat, palpitations or shortness of breath Gastrointestinal ROS: no abdominal pain, change in bowel habits, or black or bloody stools Genito-Urinary ROS: no dysuria, trouble voiding, or hematuria Musculoskeletal ROS: negative for - joint pain or joint stiffness Neurological ROS: negative for - bowel and bladder control changes Dermatological ROS: negative for rash and skin lesion changes   Objective:   BP 124/82   Pulse (!) 102   Wt 128 lb 14.4 oz (58.5 kg)   BMI 26.03 kg/m  CONSTITUTIONAL: Well-developed, well-nourished female in no acute distress.  PSYCHIATRIC: Normal mood and affect. Normal behavior. Normal judgment and thought content. NEUROLGIC: Alert and oriented to person, place, and time. Normal muscle tone coordination. No cranial nerve deficit noted. HENT:  Normocephalic, atraumatic, External right and left ear normal. Oropharynx is clear and moist EYES: Conjunctivae and EOM are normal. Pupils are equal, round, and reactive to light. No scleral icterus.  NECK: Normal range of motion, supple, no masses.  Normal thyroid.  SKIN: Skin is warm and dry. No rash noted. Not diaphoretic. No erythema. No pallor. CARDIOVASCULAR: Normal heart rate noted, regular rhythm, no murmur. RESPIRATORY: Clear  to auscultation bilaterally. Effort and breath sounds normal, no problems with respiration noted. BREASTS: Symmetric in size. No masses, skin changes, nipple drainage, or lymphadenopathy. ABDOMEN: Soft, normal bowel sounds, no distention noted.  No tenderness, rebound or guarding.  BLADDER: Normal Pelvic exam:             External genitalia: Normal, except for atrophic changes.             BUS: Normal             Vagina: Mild atrophic changes; no lesions.             Cervix: Surgically absent             Uterus: Surgically absent             Bimanual: No palpable masses or tenderness.             Rectovaginal: Normal.  External  exam; normal sphincter tone,; no rectal masses. MUSCULOSKELETAL: Normal range of motion. No tenderness.  No cyanosis, clubbing, or edema.  2+ distal pulses. LYMPHATIC: No Axillary, Supraclavicular, or Inguinal Adenopathy.    Assessment:   Annual gynecologic examination 70 y.o.  Menopausal- symptomatic; taken off of hormone replacement due to increased liver function enzymes Vaginal atrophy- treatment with topical Estrace cream History of left breast cyst- status post excision for tenderness Contraception: status post hysterectomy Normal BMI Problem List Items Addressed This Visit    Surgical menopause    Other Visit Diagnoses    Well woman exam    -  Primary   Family history of breast cancer       Status post total hysterectomy and bilateral salpingo-oophorectomy (BSO)       Screening for colon cancer       Screening for breast cancer          Plan:  Pap: Not needed Mammogram: Ordered Stool Guaiac Testing:  Ordered Labs:Thru pcp Routine preventative health maintenance measures emphasized: Exercise/Diet/Weight control, Tobacco Warnings and Alcohol/Substance use risks  Continue with Estrace cream intravaginal for vaginal atrophy Patient is moving to [place] annual follow-up there  Corena Herter, PA-S Darol Destine, CMA  Herold Harms,  MD  Note: This dictation was prepared with Dragon dictation along with smaller phrase technology. Any transcriptional errors that result from this process are unintentional.

## 2016-01-02 ENCOUNTER — Encounter: Payer: Self-pay | Admitting: Obstetrics and Gynecology

## 2016-01-02 ENCOUNTER — Ambulatory Visit (INDEPENDENT_AMBULATORY_CARE_PROVIDER_SITE_OTHER): Payer: Medicare Other | Admitting: Obstetrics and Gynecology

## 2016-01-02 VITALS — BP 124/82 | HR 102 | Wt 128.9 lb

## 2016-01-02 DIAGNOSIS — Z01419 Encounter for gynecological examination (general) (routine) without abnormal findings: Secondary | ICD-10-CM

## 2016-01-02 DIAGNOSIS — E8940 Asymptomatic postprocedural ovarian failure: Secondary | ICD-10-CM

## 2016-01-02 DIAGNOSIS — Z803 Family history of malignant neoplasm of breast: Secondary | ICD-10-CM | POA: Diagnosis not present

## 2016-01-02 DIAGNOSIS — Z90722 Acquired absence of ovaries, bilateral: Secondary | ICD-10-CM

## 2016-01-02 DIAGNOSIS — Z1239 Encounter for other screening for malignant neoplasm of breast: Secondary | ICD-10-CM

## 2016-01-02 DIAGNOSIS — Z8742 Personal history of other diseases of the female genital tract: Secondary | ICD-10-CM

## 2016-01-02 DIAGNOSIS — Z9079 Acquired absence of other genital organ(s): Secondary | ICD-10-CM | POA: Diagnosis not present

## 2016-01-02 DIAGNOSIS — Z1211 Encounter for screening for malignant neoplasm of colon: Secondary | ICD-10-CM

## 2016-01-02 DIAGNOSIS — Z90710 Acquired absence of both cervix and uterus: Secondary | ICD-10-CM | POA: Diagnosis not present

## 2016-01-02 DIAGNOSIS — Z1231 Encounter for screening mammogram for malignant neoplasm of breast: Secondary | ICD-10-CM

## 2016-01-02 MED ORDER — ESTRADIOL 0.1 MG/GM VA CREA: 1.0000 | TOPICAL_CREAM | VAGINAL | 2 refills | Status: DC

## 2016-01-02 NOTE — Patient Instructions (Signed)
1. No Pap smear is needed. 2. Mammogram is ordered 3. Stool guaiac card testing is given for colon cancer screening 4. Screening labs are obtained through primary care 5. Continue with healthy eating and exercise 6. Encourage calcium with vitamin D supplementation daily 7. Return in 1 year

## 2016-01-10 ENCOUNTER — Other Ambulatory Visit: Payer: Self-pay | Admitting: Obstetrics and Gynecology

## 2016-01-10 ENCOUNTER — Telehealth: Payer: Self-pay | Admitting: Obstetrics and Gynecology

## 2016-01-10 DIAGNOSIS — Z1231 Encounter for screening mammogram for malignant neoplasm of breast: Secondary | ICD-10-CM

## 2016-01-10 MED ORDER — VITAMIN D (ERGOCALCIFEROL) 1.25 MG (50000 UNIT) PO CAPS: 50000.0000 [IU] | ORAL_CAPSULE | ORAL | 0 refills | Status: DC

## 2016-01-10 NOTE — Telephone Encounter (Signed)
Pt said she needs rx for vit d.  50,000 ml  [facility] also estrdiol rx

## 2016-01-10 NOTE — Telephone Encounter (Signed)
Pt aware vit d will be erxed.  Estrace was erx at her [facility].

## 2016-01-11 ENCOUNTER — Ambulatory Visit
Admission: RE | Admit: 2016-01-11 | Discharge: 2016-01-11 | Disposition: A | Discharge status: Home / Self Care | Payer: Medicare Other | Source: Ambulatory Visit | Attending: Obstetrics and Gynecology | Admitting: Obstetrics and Gynecology

## 2016-01-11 DIAGNOSIS — Z1231 Encounter for screening mammogram for malignant neoplasm of breast: Secondary | ICD-10-CM | POA: Diagnosis present

## 2016-01-18 LAB — FECAL OCCULT BLOOD, IMMUNOCHEMICAL: FECAL OCCULT BLD: NEGATIVE

## 2016-04-04 ENCOUNTER — Other Ambulatory Visit: Payer: Self-pay | Admitting: Obstetrics and Gynecology

## 2016-06-26 ENCOUNTER — Other Ambulatory Visit: Payer: Self-pay | Admitting: Obstetrics and Gynecology

## 2016-10-06 ENCOUNTER — Other Ambulatory Visit: Payer: Self-pay | Admitting: Obstetrics and Gynecology

## 2016-10-06 DIAGNOSIS — Z1211 Encounter for screening for malignant neoplasm of colon: Secondary | ICD-10-CM

## 2016-10-08 IMAGING — CT CT NECK WITHOUT AND WITH CONTRAST
3 series · 8 of 14 positions shown, 9 images · IV contrast (omnipaque)
Comparison: CT head without contrast 10/19/2007

CLINICAL DATA: Left-sided parotid pain and swelling 10 days.

EXAM:
CT NECK WITH AND WITHOUT CONTRAST
TECHNIQUE: Multidetector CT imaging of the neck was performed without and with
intravenous contrast.
CONTRAST:  75 mL Omnipaque 300

[Series 2: soft tissue w/o · axial · non-contrast · 0.51mm/px · z∈[-244,-208]mm · 2 of 38 slices shown]
[im 13/38  soft-tissue]
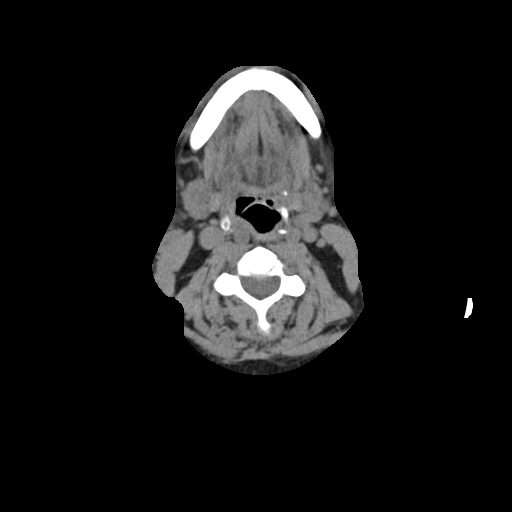
[im 25/38  soft-tissue]
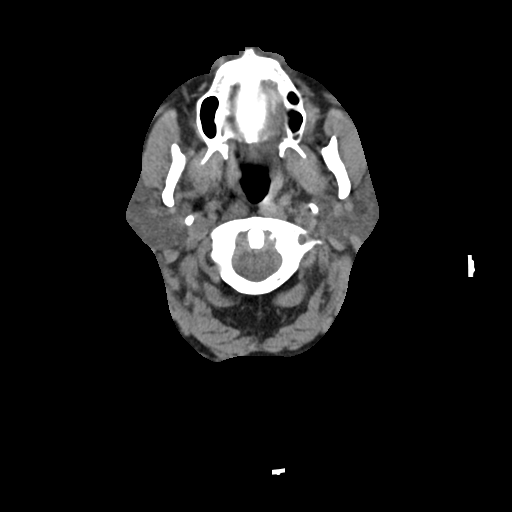

[Series 4: soft tissue w · axial · 0.51mm/px · z∈[-352,-216]mm · 4 of 75 slices shown, 5 images]
[im 15/75  soft-tissue]
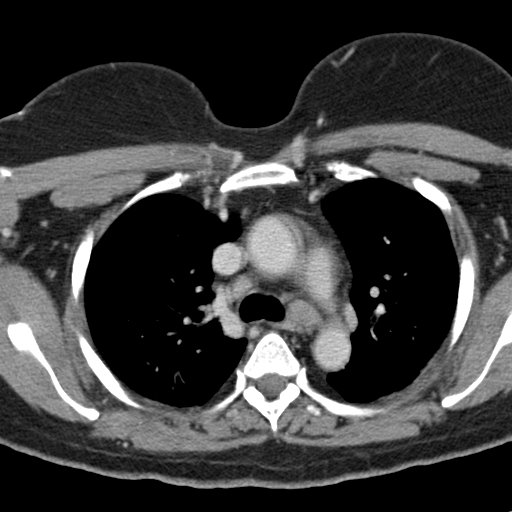
[im 15/75  bone]
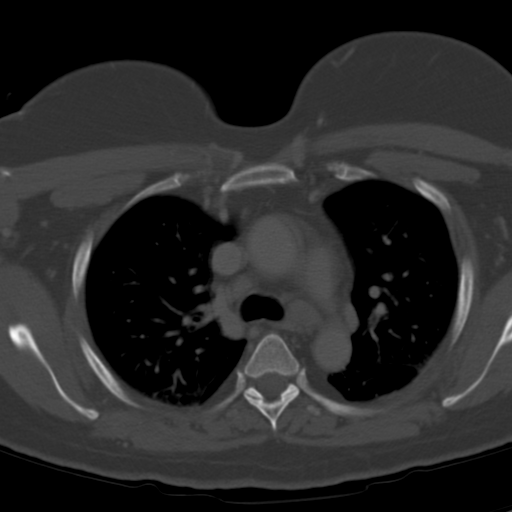
[im 30/75  bone]
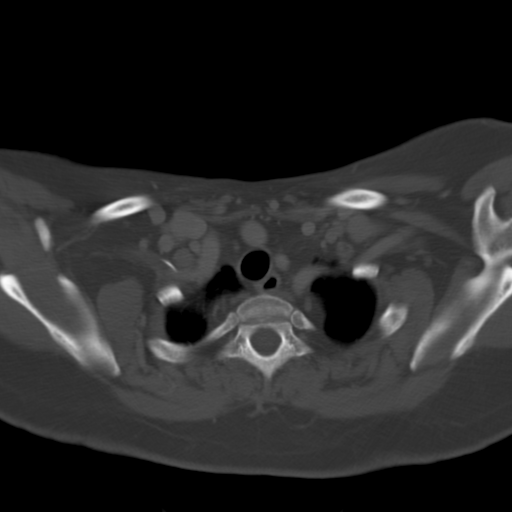
[im 45/75  bone]
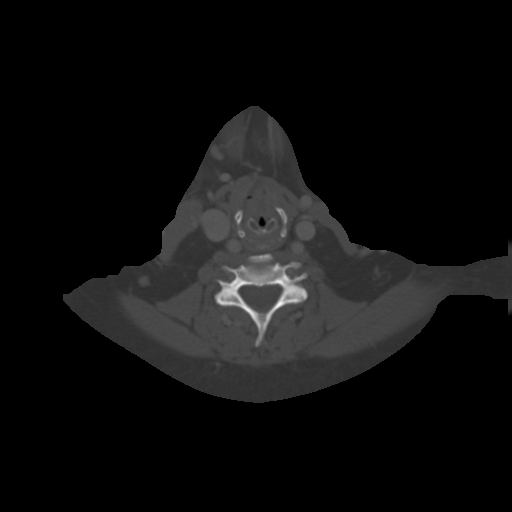
[im 60/75  bone]
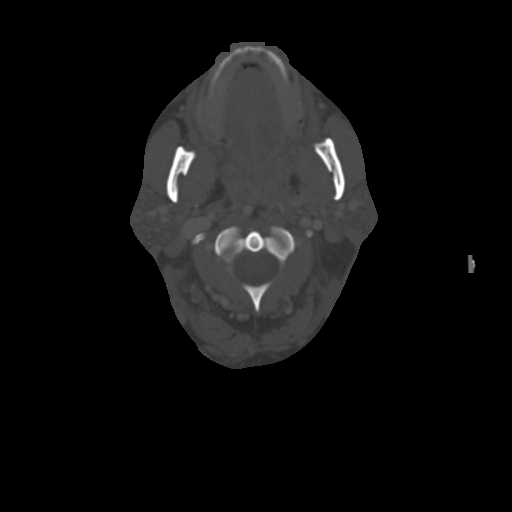

[Series 6: lung windows · axial · 0.66mm/px · z∈[-346,-298]mm · 2 of 49 slices shown]
[im 17/49  bone]
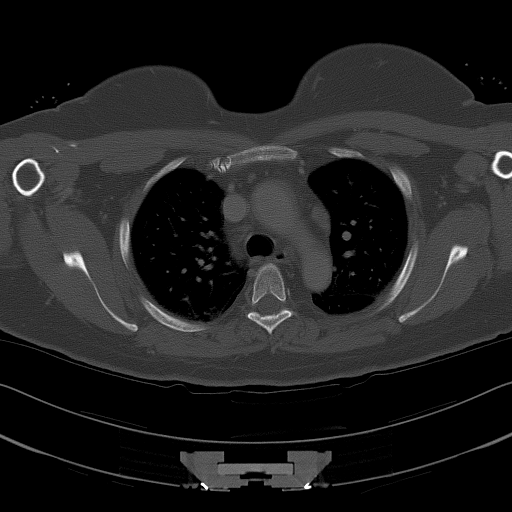
[im 33/49  bone]
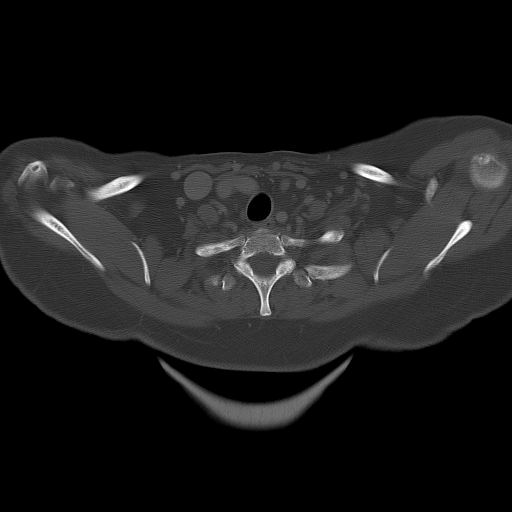

[8 of 14 positions shown; findings below may reference images not displayed]

FINDINGS: Calcifications are noted along the external auditory canals
bilaterally. This may be related to prior infection. In nonenhancing
lesion in the posterior right neck measures 14 x 15 mm. This likely
represents a sebaceous cyst.

There is mild prominence of the palatine tonsils bilaterally. No
focal mucosal or submucosal lesions are present. The vocal cords are
midline and symmetric.

There is mild prominence of the parotid glands bilaterally. There is
no focal inflammation or mass lesion. No stone is evident. The
submandibular glands are within normal limits bilaterally.

No significant cervical adenopathy is present. A 10 mm prevascular
node is present in the superior mediastinum. A 9 mm right
peritracheal node is present just above the carina. 11 mm short axis
node is noted along the left main bronchus. The right hilar mobile
mass measures 2.1 x 2.3 cm.

Bone windows demonstrate mild degenerative changes in the cervical
spine, most pronounced at C5-6. No focal lytic or blastic lesions
are present. The patient is edentulous.
IMPRESSION: 1. Mild prominence of both parotid glands without significant focal
enhancement or mass lesion. There are no stones. This can be seen in
the setting of a viral parotitis.
2. Mild prominence the palatine tonsils bilaterally. This could be
related to an upper respiratory infection
3. Prominent superior mediastinal lymph nodes and right hilar
adenopathy. This raises concern for occult neoplasm. Recommend CT
the chest with contrast for further evaluation.
4. Mild degenerative changes within the cervical spine.
5. 15 mm subcutaneous lesion in the posterior right neck likely
represents a sebaceous cyst. This area should be amenable to direct
visualization.

## 2017-11-06 ENCOUNTER — Other Ambulatory Visit: Payer: Self-pay | Admitting: Obstetrics and Gynecology

## 2017-11-06 DIAGNOSIS — Z1211 Encounter for screening for malignant neoplasm of colon: Secondary | ICD-10-CM
